# Patient Record
Sex: Male | Born: 1990 | Race: White | Hispanic: No | Marital: Single | Smoking: Never smoker
Health system: Southern US, Community
[De-identification: ages and names within clinical notes are randomized; demographics above are authoritative.]

## PROBLEM LIST (undated history)

## (undated) HISTORY — PX: BACK SURGERY: SHX140

---

## 2019-10-07 ENCOUNTER — Encounter: Payer: Self-pay | Admitting: Emergency Medicine

## 2019-10-07 ENCOUNTER — Emergency Department
Admission: EM | Admit: 2019-10-07 | Discharge: 2019-10-07 | Disposition: A | Discharge status: Home / Self Care | Payer: Self-pay | Attending: Emergency Medicine | Admitting: Emergency Medicine

## 2019-10-07 ENCOUNTER — Other Ambulatory Visit: Payer: Self-pay

## 2019-10-07 DIAGNOSIS — N390 Urinary tract infection, site not specified: Secondary | ICD-10-CM | POA: Insufficient documentation

## 2019-10-07 LAB — URINALYSIS, COMPLETE (UACMP) WITH MICROSCOPIC
Bilirubin Urine: NEGATIVE
Glucose, UA: NEGATIVE mg/dL
Hgb urine dipstick: NEGATIVE
Ketones, ur: NEGATIVE mg/dL
Leukocytes,Ua: NEGATIVE
Nitrite: NEGATIVE
Protein, ur: NEGATIVE mg/dL
Specific Gravity, Urine: 1.02 (ref 1.005–1.030)
Squamous Epithelial / HPF: NONE SEEN (ref 0–5)
pH: 7 (ref 5.0–8.0)

## 2019-10-07 LAB — COMPREHENSIVE METABOLIC PANEL
ALT: 19 U/L (ref 0–44)
AST: 19 U/L (ref 15–41)
Albumin: 4.8 g/dL (ref 3.5–5.0)
Alkaline Phosphatase: 43 U/L (ref 38–126)
Anion gap: 7 (ref 5–15)
BUN: 19 mg/dL (ref 6–20)
CO2: 28 mmol/L (ref 22–32)
Calcium: 9.4 mg/dL (ref 8.9–10.3)
Chloride: 102 mmol/L (ref 98–111)
Creatinine, Ser: 0.64 mg/dL (ref 0.61–1.24)
GFR calc Af Amer: >60 mL/min (ref >60)
GFR calc non Af Amer: >60 mL/min (ref >60)
Glucose, Bld: 142 mg/dL — ABNORMAL HIGH (ref 70–99)
Potassium: 4 mmol/L (ref 3.5–5.1)
Sodium: 137 mmol/L (ref 135–145)
Total Bilirubin: 1.4 mg/dL — ABNORMAL HIGH (ref 0.3–1.2)
Total Protein: 7.7 g/dL (ref 6.5–8.1)

## 2019-10-07 LAB — CBC
HCT: 42.6 % (ref 39.0–52.0)
Hemoglobin: 14.5 g/dL (ref 13.0–17.0)
MCH: 31.2 pg (ref 26.0–34.0)
MCHC: 34 g/dL (ref 30.0–36.0)
MCV: 91.6 fL (ref 80.0–100.0)
Platelets: 224 10*3/uL (ref 150–400)
RBC: 4.65 MIL/uL (ref 4.22–5.81)
RDW: 12 % (ref 11.5–15.5)
WBC: 15.8 10*3/uL — ABNORMAL HIGH (ref 4.0–10.5)
nRBC: 0 % (ref 0.0–0.2)

## 2019-10-07 LAB — LIPASE, BLOOD: Lipase: 37 U/L (ref 11–51)

## 2019-10-07 MED ORDER — SODIUM CHLORIDE 0.9% FLUSH: 3.0000 mL | Freq: Once | INTRAVENOUS | Status: DC

## 2019-10-07 MED ORDER — CEFTRIAXONE SODIUM 1 G IJ SOLR
1.0000 g | Freq: Once | INTRAMUSCULAR | Status: AC
  Administered 2019-10-07: 1 g via INTRAMUSCULAR

## 2019-10-07 MED ORDER — LIDOCAINE HCL (PF) 1 % IJ SOLN
5.0000 mL | Freq: Once | INTRAMUSCULAR | Status: AC
  Administered 2019-10-07: 5 mL
  Filled 2019-10-07: qty 5

## 2019-10-07 MED ORDER — CIPROFLOXACIN HCL 500 MG PO TABS
500.0000 mg | ORAL_TABLET | Freq: Two times a day (BID) | ORAL | 0 refills | Status: AC
Start: 2019-10-07 — End: 2019-10-17

## 2019-10-07 NOTE — ED Triage Notes (Addendum)
C/O lower abdominal pain today.   Denies diarrhea and vomiting.  C/O nausea and states pain worsens when walking.  States took antibiotic last night for chlamydia treatment.  AAOx3.  Skin warm and dry. NAD

## 2019-10-07 NOTE — ED Provider Notes (Signed)
[facility] Emergency Department Provider Note   ____________________________________________    I have reviewed the triage vital signs and the nursing notes.   HISTORY  Chief Complaint Abdominal Pain     HPI Arthur Dean is a 29 y.o. male who presents with complaints of suprapubic pain which started 1 day ago and seems to be getting worse.  He reports it is worse when he urinates.  Denies dysuria.  Denies penile discharge.  He reports his male partner was recently treated for chlamydia and he was also given a dose of antibiotics to be treated as the partner which he did take.  Denies fevers or chills.  No back pain.  History reviewed. No pertinent past medical history.  There are no problems to display for this patient.   Past Surgical History:  Procedure Laterality Date  . BACK SURGERY      Prior to Admission medications   Medication Sig Start Date End Date Taking? Authorizing Provider  ciprofloxacin (CIPRO) 500 MG tablet Take 1 tablet (500 mg total) by mouth 2 (two) times daily for 10 days. 10/07/19 10/17/19  Jene Every, MD     Allergies Amoxicillin  No family history on file.  Social History Social History   Tobacco Use  . Smoking status: Never Smoker  . Smokeless tobacco: Never Used  Substance Use Topics  . Alcohol use: Not Currently  . Drug use: Not Currently    Review of Systems  Constitutional: No fever/chills Eyes: No visual changes.  ENT: No sore throat. Cardiovascular: Denies chest pain. Respiratory: Denies shortness of breath. Gastrointestinal: As above Genitourinary: As above Musculoskeletal: Negative for back pain. Skin: Negative for rash. Neurological: Negative for headaches or weakness   ____________________________________________   PHYSICAL EXAM:  VITAL SIGNS: ED Triage Vitals  Enc Vitals Group     BP 10/07/19 1426 112/65     Pulse Rate 10/07/19 1426 90     Resp 10/07/19 1426 16     Temp  10/07/19 1426 98 F (36.7 C)     Temp Source 10/07/19 1426 Oral     SpO2 10/07/19 1426 98 %     Weight 10/07/19 1420 63.5 kg (140 lb)     Height 10/07/19 1420 1.88 m (6\' 2" )     Head Circumference --      Peak Flow --      Pain Score 10/07/19 1419 6     Pain Loc --      Pain Edu? --      Excl. in GC? --     Constitutional: Alert and oriented.  Nose: No congestion/rhinnorhea. Mouth/Throat: Mucous membranes are moist.    Cardiovascular: Normal rate, regular rhythm.   Good peripheral circulation. Respiratory: Normal respiratory effort.  No retractions.  Gastrointestinal: Soft, No distention.  No CVA tenderness. GU: No penile discharge, mild tenderness suprapubically musculoskeletal: No lower extremity tenderness nor edema.  Warm and well perfused Neurologic:  Normal speech and language.  Skin:  Skin is warm, dry and intact. No rash noted. Psychiatric: Mood and affect are normal. Speech and behavior are normal.  ____________________________________________   LABS (all labs ordered are listed, but only abnormal results are displayed)  Labs Reviewed  COMPREHENSIVE METABOLIC PANEL - Abnormal; Notable for the following components:      Result Value   Glucose, Bld 142 (*)    Total Bilirubin 1.4 (*)    All other components within normal limits  CBC - Abnormal; Notable for the following  components:   WBC 15.8 (*)    All other components within normal limits  URINALYSIS, COMPLETE (UACMP) WITH MICROSCOPIC - Abnormal; Notable for the following components:   Color, Urine YELLOW (*)    APPearance CLOUDY (*)    Bacteria, UA RARE (*)    All other components within normal limits  URINE CULTURE  LIPASE, BLOOD   ____________________________________________  EKG  None ____________________________________________  RADIOLOGY   ____________________________________________   PROCEDURES  Procedure(s) performed: No  Procedures   Critical Care performed:  No ____________________________________________   INITIAL IMPRESSION / ASSESSMENT AND PLAN / ED COURSE  Pertinent labs & imaging results that were available during my care of the patient were reviewed by me and considered in my medical decision making (see chart for details).  Patient well-appearing in no acute distress, lab work significant for elevated white blood cell count of 15.8 however the patient is afebrile without tachycardia.  Urinalysis most consistent with urinary tract infection will give 1 g IM Rocephin, p.o. antibiotics.  Close outpatient follow-up, strict return precautions discussed.    ____________________________________________   FINAL CLINICAL IMPRESSION(S) / ED DIAGNOSES  Final diagnoses:  Lower urinary tract infectious disease        Note:  This document was prepared using Dragon voice recognition software and may include unintentional dictation errors.   Lavonia Drafts, MD 10/07/19 [phone]

## 2019-10-08 LAB — URINE CULTURE: Culture: NO GROWTH

## 2020-01-05 ENCOUNTER — Other Ambulatory Visit: Payer: Self-pay

## 2020-01-05 ENCOUNTER — Emergency Department: Payer: Self-pay

## 2020-01-05 ENCOUNTER — Emergency Department
Admission: EM | Admit: 2020-01-05 | Discharge: 2020-01-05 | Disposition: A | Discharge status: Home / Self Care | Payer: Self-pay | Attending: Student in an Organized Health Care Education/Training Program | Admitting: Student in an Organized Health Care Education/Training Program

## 2020-01-05 DIAGNOSIS — Z20822 Contact with and (suspected) exposure to covid-19: Secondary | ICD-10-CM | POA: Insufficient documentation

## 2020-01-05 DIAGNOSIS — J069 Acute upper respiratory infection, unspecified: Secondary | ICD-10-CM | POA: Insufficient documentation

## 2020-01-05 LAB — SARS CORONAVIRUS 2 BY RT PCR (HOSPITAL ORDER, PERFORMED IN ~~LOC~~ HOSPITAL LAB): SARS Coronavirus 2: NEGATIVE

## 2020-01-05 MED ORDER — KETOROLAC TROMETHAMINE 10 MG PO TABS
10.0000 mg | ORAL_TABLET | Freq: Four times a day (QID) | ORAL | 0 refills | Status: DC | PRN
Start: 2020-01-05 — End: 2020-05-17

## 2020-01-05 MED ORDER — PSEUDOEPH-BROMPHEN-DM 30-2-10 MG/5ML PO SYRP
5.0000 mL | ORAL_SOLUTION | Freq: Four times a day (QID) | ORAL | 0 refills | Status: DC | PRN
Start: 2020-01-05 — End: 2020-05-17

## 2020-01-05 MED ORDER — KETOROLAC TROMETHAMINE 60 MG/2ML IM SOLN
60.0000 mg | Freq: Once | INTRAMUSCULAR | Status: AC
  Administered 2020-01-05: 60 mg via INTRAMUSCULAR
  Filled 2020-01-05: qty 2

## 2020-01-05 NOTE — ED Provider Notes (Signed)
[facility] Emergency Department Provider Note   ____________________________________________   First MD Initiated Contact with Patient 01/05/20 1311     (approximate)  I have reviewed the triage vital signs and the nursing notes.   HISTORY  Chief Complaint URI    HPI Arthur Dean is a 29 y.o. male patient: Call for any notes of body aches for 2 days.  Patient denies recent travel or known contact with COVID-19.  Patient has not received a vaccine for COVID-19.  Patient rates his pain as a 5/10.  Patient described pain as "achy".  No palliative measures for complaint.         History reviewed. No pertinent past medical history.  There are no problems to display for this patient.   Past Surgical History:  Procedure Laterality Date  . BACK SURGERY      Prior to Admission medications   Medication Sig Start Date End Date Taking? Authorizing Provider  brompheniramine-pseudoephedrine-DM 30-2-10 MG/5ML syrup Take 5 mLs by mouth 4 (four) times daily as needed. 01/05/20   Joni Reining, PA-C  ketorolac (TORADOL) 10 MG tablet Take 1 tablet (10 mg total) by mouth every 6 (six) hours as needed. 01/05/20   Joni Reining, PA-C    Allergies Amoxicillin  No family history on file.  Social History Social History   Tobacco Use  . Smoking status: Never Smoker  . Smokeless tobacco: Never Used  Substance Use Topics  . Alcohol use: Not Currently  . Drug use: Not Currently    Review of Systems Constitutional: No fever/chills.  Body aches. Eyes: No visual changes. ENT: No sore throat.  Runny nose. Cardiovascular: Denies chest pain. Respiratory: Denies shortness of breath.  Nonproductive cough. Gastrointestinal: No abdominal pain.  No nausea, no vomiting.  No diarrhea.  No constipation. Genitourinary: Negative for dysuria. Musculoskeletal: Negative for back pain. Skin: Negative for rash. Neurological: Negative for headaches, focal weakness or  numbness. Allergic/Immunilogical: Amoxicillin ____________________________________________   PHYSICAL EXAM:  VITAL SIGNS: ED Triage Vitals  Enc Vitals Group     BP 01/05/20 1258 105/61     Pulse Rate 01/05/20 1258 (!) 56     Resp 01/05/20 1256 17     Temp 01/05/20 1300 98.1 F (36.7 C)     Temp Source 01/05/20 1256 Oral     SpO2 01/05/20 1258 100 %     Weight 01/05/20 1257 145 lb (65.8 kg)     Height 01/05/20 1257 6\' 2"  (1.88 m)     Head Circumference --      Peak Flow --      Pain Score 01/05/20 1257 5     Pain Loc --      Pain Edu? --      Excl. in GC? --     Constitutional: Alert and oriented. Well appearing and in no acute distress. Eyes: Conjunctivae are normal. PERRL. EOMI. Head: Atraumatic. Nose: Edematous nasal turbinates clear rhinorrhea. Mouth/Throat: Mucous membranes are moist.  Oropharynx non-erythematous.  Postnasal drainage. Neck: No stridor.  Hematological/Lymphatic/Immunilogical: No cervical lymphadenopathy. Cardiovascular: Bradycardic., regular rhythm. Grossly normal heart sounds.  Good peripheral circulation. Respiratory: Normal respiratory effort.  No retractions. Lungs CTAB. Gastrointestinal: Soft and nontender. No distention. No abdominal bruits. No CVA tenderness. Neurologic:  Normal speech and language. No gross focal neurologic deficits are appreciated. No gait instability. Skin:  Skin is warm, dry and intact. No rash noted. Psychiatric: Mood and affect are normal. Speech and behavior are normal.  ____________________________________________  LABS (all labs ordered are listed, but only abnormal results are displayed)  Labs Reviewed  SARS CORONAVIRUS 2 BY RT PCR (HOSPITAL ORDER, PERFORMED IN  HOSPITAL LAB)   ____________________________________________  EKG   ____________________________________________  RADIOLOGY  ED MD interpretation:    Official radiology report(s): DG Chest Portable 1 View  Result Date:  01/05/2020 CLINICAL DATA:  Cough and congestion. EXAM: PORTABLE CHEST 1 VIEW COMPARISON:  None. FINDINGS: The heart size and mediastinal contours are within normal limits. Both lungs are clear. No pleural effusion or pneumothorax. Skeletal structures are intact. IMPRESSION: No active disease. Electronically Signed   By: Amie Portland M.D.   On: 01/05/2020 13:57    ____________________________________________   PROCEDURES  Procedure(s) performed (including Critical Care):  Procedures   ____________________________________________   INITIAL IMPRESSION / ASSESSMENT AND PLAN / ED COURSE  As part of my medical decision making, I reviewed the following data within the electronic MEDICAL RECORD NUMBER     Patient presents with cute onset of cough, runny nose, and body aches. Patient was concerned for COVID-19 as he is not taking the vaccine. Discussed negative COVID-19 test results with patient. No acute findings on chest x-ray. Patient complaining physical exam consistent with viral respiratory infection. Patient given discharge care instruction advised take medication as directed. Patient advised to consider getting the COVID-19 vaccine. Advised to establish care with PCP.    [name] was evaluated in Emergency Department on 01/05/2020 for the symptoms described in the history of present illness. He was evaluated in the context of the global COVID-19 pandemic, which necessitated consideration that the patient might be at risk for infection with the SARS-CoV-2 virus that causes COVID-19. Institutional protocols and algorithms that pertain to the evaluation of patients at risk for COVID-19 are in a state of rapid change based on information released by regulatory bodies including the CDC and federal and state organizations. These policies and algorithms were followed during the patient's care in the ED.       ____________________________________________   FINAL CLINICAL IMPRESSION(S) / ED  DIAGNOSES  Final diagnoses:  Acute upper respiratory infection     ED Discharge Orders         Ordered    brompheniramine-pseudoephedrine-DM 30-2-10 MG/5ML syrup  4 times daily PRN     Discontinue  Reprint     01/05/20 1534    ketorolac (TORADOL) 10 MG tablet  Every 6 hours PRN     Discontinue  Reprint     01/05/20 1534           Note:  This document was prepared using Dragon voice recognition software and may include unintentional dictation errors.    Joni Reining, PA-C 01/05/20 1537    Willy Eddy, MD 01/05/20 [phone]

## 2020-01-05 NOTE — Discharge Instructions (Addendum)
COVID-19 test was negative. His chest x-ray also was unremarkable. Follow discharge care instruction take medication as directed. Take Tylenol as needed for fever. Consider getting the vaccine as soon as possible.

## 2020-01-05 NOTE — ED Triage Notes (Signed)
Pt c/o cough, runny nose, and body aches since yesterday. 

## 2020-01-05 NOTE — ED Notes (Signed)
See triage note  Presents with cough and chest discomfort with cough  And nausea    States body aches started yesterday  Afebrile on arrival

## 2020-02-04 ENCOUNTER — Emergency Department: Payer: HRSA Program

## 2020-02-04 ENCOUNTER — Emergency Department
Admission: EM | Admit: 2020-02-04 | Discharge: 2020-02-04 | Disposition: A | Discharge status: Home / Self Care | Payer: HRSA Program | Attending: Emergency Medicine | Admitting: Emergency Medicine

## 2020-02-04 ENCOUNTER — Other Ambulatory Visit: Payer: Self-pay

## 2020-02-04 DIAGNOSIS — W010XXA Fall on same level from slipping, tripping and stumbling without subsequent striking against object, initial encounter: Secondary | ICD-10-CM | POA: Insufficient documentation

## 2020-02-04 DIAGNOSIS — M549 Dorsalgia, unspecified: Secondary | ICD-10-CM | POA: Diagnosis present

## 2020-02-04 DIAGNOSIS — Z20822 Contact with and (suspected) exposure to covid-19: Secondary | ICD-10-CM | POA: Insufficient documentation

## 2020-02-04 DIAGNOSIS — W19XXXA Unspecified fall, initial encounter: Secondary | ICD-10-CM

## 2020-02-04 LAB — SARS CORONAVIRUS 2 BY RT PCR (HOSPITAL ORDER, PERFORMED IN ~~LOC~~ HOSPITAL LAB): SARS Coronavirus 2: NEGATIVE

## 2020-02-04 NOTE — ED Triage Notes (Signed)
Pt states he slipped on steps letting th dog out and hit his mid back and having pain since.. pt also states he wants a covid test states his step son is positive denies having sx at present.

## 2020-02-04 NOTE — ED Notes (Signed)
See triage note  Presents with lower back pain  States he slipped on wet step  Hitting his back  Also states he has been exposed to COVID by room mate  Denies any sx's

## 2020-02-04 NOTE — ED Provider Notes (Signed)
[facility] Emergency Department Provider Note  ____________________________________________  Time seen: Approximately 3:01 PM  I have reviewed the triage vital signs and the nursing notes.   HISTORY  Chief Complaint Back Pain and Covid Exposure    HPI Arthur Dean is a 29 y.o. male that presents to the emergency department for a Covid test hand for evaluation of mild low back pain after falling this morning.  Patient was letting out the dog when he slipped and his back hit the bottom step.  He did not fall down the steps.  He has been walking normally since.  He does not feel that anything is broken.  He came to the emergency department because his stepson is positive for COVID-19 and he would like a Covid test.  He is scheduled to have the Pfizer vaccine today at 330 if his test is negative.   History reviewed. No pertinent past medical history.  There are no problems to display for this patient.   Past Surgical History:  Procedure Laterality Date  . BACK SURGERY      Prior to Admission medications   Medication Sig Start Date End Date Taking? Authorizing Provider  brompheniramine-pseudoephedrine-DM 30-2-10 MG/5ML syrup Take 5 mLs by mouth 4 (four) times daily as needed. 01/05/20   Joni Reining, PA-C  ketorolac (TORADOL) 10 MG tablet Take 1 tablet (10 mg total) by mouth every 6 (six) hours as needed. 01/05/20   Joni Reining, PA-C    Allergies Amoxicillin  No family history on file.  Social History Social History   Tobacco Use  . Smoking status: Never Smoker  . Smokeless tobacco: Never Used  Substance Use Topics  . Alcohol use: Not Currently  . Drug use: Not Currently     Review of Systems  Constitutional: No fever/chills ENT: No upper respiratory complaints. Cardiovascular: No chest pain. Respiratory: No cough. No SOB. Gastrointestinal: No abdominal pain.  No nausea, no vomiting.  Musculoskeletal: Positive for back  pain. Skin: Negative for rash, abrasions, lacerations, ecchymosis.   ____________________________________________   PHYSICAL EXAM:  VITAL SIGNS: ED Triage Vitals  Enc Vitals Group     BP 02/04/20 1127 (!) 106/47     Pulse Rate 02/04/20 1127 91     Resp 02/04/20 1127 16     Temp 02/04/20 1127 98.1 F (36.7 C)     Temp Source 02/04/20 1127 Oral     SpO2 02/04/20 1127 99 %     Weight 02/04/20 1125 143 lb (64.9 kg)     Height 02/04/20 1125 6\' 2"  (1.88 m)     Head Circumference --      Peak Flow --      Pain Score 02/04/20 1124 8     Pain Loc --      Pain Edu? --      Excl. in GC? --      Constitutional: Alert and oriented. Well appearing and in no acute distress. Eyes: Conjunctivae are normal. PERRL. EOMI. Head: Atraumatic. ENT:      Ears:      Nose: No congestion/rhinnorhea.      Mouth/Throat: Mucous membranes are moist.  Neck: No stridor.  Cardiovascular: Normal rate, regular rhythm.  Good peripheral circulation. Respiratory: Normal respiratory effort without tachypnea or retractions. Lungs CTAB. Good air entry to the bases with no decreased or absent breath sounds. Musculoskeletal: Full range of motion to all extremities. No gross deformities appreciated.  Tenderness to palpation to lumbar spine and lumbar paraspinal  muscles.  No pinpoint tenderness.  Strength equal in lower extremities bilaterally normal gait. Neurologic:  Normal speech and language. No gross focal neurologic deficits are appreciated.  Skin:  Skin is warm, dry and intact. No rash noted. Psychiatric: Mood and affect are normal. Speech and behavior are normal. Patient exhibits appropriate insight and judgement.   ____________________________________________   LABS (all labs ordered are listed, but only abnormal results are displayed)  Labs Reviewed  SARS CORONAVIRUS 2 BY RT PCR (HOSPITAL ORDER, PERFORMED IN [facility])    ____________________________________________  EKG   ____________________________________________  RADIOLOGY Lexine Baton, personally viewed and evaluated these images (plain radiographs) as part of my medical decision making, as well as reviewing the written report by the radiologist.  DG Lumbar Spine 2-3 Views  Result Date: 02/04/2020 CLINICAL DATA:  Low back pain after fall. EXAM: LUMBAR SPINE - 2-3 VIEW COMPARISON:  None. FINDINGS: There is no evidence of lumbar spine fracture. Alignment is normal. Intervertebral disc spaces are maintained. IMPRESSION: Negative. Electronically Signed   By: Lupita Raider M.D.   On: 02/04/2020 14:27    ____________________________________________    PROCEDURES  Procedure(s) performed:    Procedures    Medications - No data to display   ____________________________________________   INITIAL IMPRESSION / ASSESSMENT AND PLAN / ED COURSE  Pertinent labs & imaging results that were available during my care of the patient were reviewed by me and considered in my medical decision making (see chart for details).  Review of the [place] CSRS was performed in accordance of the [facility] prior to dispensing any controlled drugs.    Patient presented to emergency department for a Covid test and for evaluation after a fall this morning.  Vital signs and exam are reassuring.  Covid test is negative.  X-ray negative for acute bony abnormalities.  Patient declines any pain medication.  Patient is to follow up with primary care as directed. Patient is given ED precautions to return to the ED for any worsening or new symptoms.   Arthur Dean was evaluated in Emergency Department on 02/04/2020 for the symptoms described in the history of present illness. He was evaluated in the context of the global COVID-19 pandemic, which necessitated consideration that the patient might be at risk for infection with the SARS-CoV-2 virus that causes COVID-19.  Institutional protocols and algorithms that pertain to the evaluation of patients at risk for COVID-19 are in a state of rapid change based on information released by regulatory bodies including the CDC and federal and state organizations. These policies and algorithms were followed during the patient's care in the ED.  ____________________________________________  FINAL CLINICAL IMPRESSION(S) / ED DIAGNOSES  Final diagnoses:  Fall, initial encounter  Exposure to COVID-19 virus      NEW MEDICATIONS STARTED DURING THIS VISIT:  ED Discharge Orders    None          This chart was dictated using voice recognition software/Dragon. Despite best efforts to proofread, errors can occur which can change the meaning. Any change was purely unintentional.    Enid Derry, PA-C 02/04/20 1524    Gilles Chiquito, MD 02/04/20 [phone]

## 2020-05-17 ENCOUNTER — Other Ambulatory Visit: Payer: Self-pay

## 2020-05-17 ENCOUNTER — Emergency Department
Admission: EM | Admit: 2020-05-17 | Discharge: 2020-05-17 | Disposition: A | Discharge status: Home / Self Care | Payer: Self-pay | Attending: Emergency Medicine | Admitting: Emergency Medicine

## 2020-05-17 DIAGNOSIS — M549 Dorsalgia, unspecified: Secondary | ICD-10-CM | POA: Insufficient documentation

## 2020-05-17 DIAGNOSIS — T8089XA Other complications following infusion, transfusion and therapeutic injection, initial encounter: Secondary | ICD-10-CM | POA: Insufficient documentation

## 2020-05-17 DIAGNOSIS — R52 Pain, unspecified: Secondary | ICD-10-CM

## 2020-05-17 MED ORDER — LIDOCAINE 5 % EX PTCH
1.0000 | MEDICATED_PATCH | Freq: Once | CUTANEOUS | Status: DC
  Administered 2020-05-17: 1 via TRANSDERMAL
  Filled 2020-05-17: qty 1

## 2020-05-17 MED ORDER — ACETAMINOPHEN-CODEINE #3 300-30 MG PO TABS: 1.0000 | ORAL_TABLET | Freq: Four times a day (QID) | ORAL | 0 refills | Status: AC | PRN

## 2020-05-17 NOTE — ED Triage Notes (Signed)
Pt comes via POV from home with c/o left arm pain following pfizer shot yesterday. Pt states pain to arm and some back pain.  No obvious rash noted.

## 2020-05-17 NOTE — Discharge Instructions (Signed)
Your exam is normal at this time. Take OTC Tylenol and Motrin as needed. Apply ice to reduce pain and swelling. Follow-up with your provider or return if needed.

## 2020-05-17 NOTE — ED Provider Notes (Signed)
[facility] Emergency Department Provider Note ____________________________________________  Time seen: 1225  I have reviewed the triage vital signs and the nursing notes.  HISTORY  Chief Complaint  Arm Pain   HPI Arthur Dean is a 29 y.o. male presents himself to the ED for evaluation of acute  abdominal pain.  Patient describes he received a the 2nd shot of his Pfizer vaccine yesterday.  He reports some pain to the upper deltoid of the left arm as well as some mild back pain.  He denies any chest pain, shortness of breath, fever, chills, sweats.  He also denies any distal paresthesias, grip changes, or upper extremity edema.  History reviewed. No pertinent past medical history.  There are no problems to display for this patient.   Past Surgical History:  Procedure Laterality Date  . BACK SURGERY      Prior to Admission medications   Medication Sig Start Date End Date Taking? Authorizing Provider  acetaminophen-codeine (TYLENOL #3) 300-30 MG tablet Take 1 tablet by mouth every 6 (six) hours as needed for moderate pain. 05/17/20   Arthur Dean, Arthur Ivory, PA-C    Allergies Amoxicillin  No family history on file.  Social History Social History   Tobacco Use  . Smoking status: Never Smoker  . Smokeless tobacco: Never Used  Substance Use Topics  . Alcohol use: Not Currently  . Drug use: Not Currently    Review of Systems  Constitutional: Negative for fever. Cardiovascular: Negative for chest pain. Respiratory: Negative for shortness of breath. Gastrointestinal: Negative for abdominal pain, vomiting and diarrhea. Genitourinary: Negative for dysuria. Musculoskeletal: Negative for back pain.  Left arm pain as above. Skin: Negative for rash. Neurological: Negative for headaches, focal weakness or numbness. ____________________________________________  PHYSICAL EXAM:  VITAL SIGNS: ED Triage Vitals  Enc Vitals Group     BP 05/17/20  1140 100/69     Pulse Rate 05/17/20 1140 89     Resp 05/17/20 1140 18     Temp 05/17/20 1140 97.7 F (36.5 C)     Temp src --      SpO2 05/17/20 1140 100 %     Weight 05/17/20 1139 145 lb (65.8 kg)     Height 05/17/20 1139 6\' 3"  (1.905 m)     Head Circumference --      Peak Flow --      Pain Score 05/17/20 1139 5     Pain Loc --      Pain Edu? --      Excl. in GC? --     Constitutional: Alert and oriented. Well appearing and in no distress. Head: Normocephalic and atraumatic. Eyes: Conjunctivae are normal. PERRL. Normal extraocular movements Neck: Supple. No thyromegaly. Hematological/Lymphatic/Immunological: No cervical lymphadenopathy. Cardiovascular: Normal rate, regular rhythm. Normal distal pulses and cap refill. Respiratory: Normal respiratory effort. No wheezes/rales/rhonchi. Gastrointestinal: Soft and nontender. No distention. Musculoskeletal: No composite fist bilaterally.  Decreased active range of motion to the left arm secondary to deltoid pain.  Patient is tender to palpation over the left deltoid.  Nontender with normal range of motion in all extremities.  Neurologic: Cranial nerves II to XII grossly intact.  Normal gait without ataxia. Normal speech and language. No gross focal neurologic deficits are appreciated. Skin:  Skin is warm, dry and intact. No rash noted.  No erythema, induration, warmth, or other skin defect is noted over the left deltoid.  No cyanosis, clubbing, or edema noted distally. Psychiatric: Mood and affect are normal. Patient  exhibits appropriate insight and judgment. ____________________________________________  PROCEDURES  Procedures  Lidoderm 5% patch - LUE ____________________________________________  INITIAL IMPRESSION / ASSESSMENT AND PLAN / ED COURSE  DDX: myalgia, injection site reaction, sterile abscess  Patient with ED evaluation of left arm pain following a vaccine given yesterday. His exam is normal and reassuring at this time.  No neuromuscular deficits noted. No indication of a sterile abscess or cellulitis. He will be treated with Tylenol, Motrin, and previously prescribed muscle relaxant. He will apply ice and/o moist heat to reduce symptoms.   Arthur Dean was evaluated in Emergency Department on 05/17/2020 for the symptoms described in the history of present illness. He was evaluated in the context of the global COVID-19 pandemic, which necessitated consideration that the patient might be at risk for infection with the SARS-CoV-2 virus that causes COVID-19. Institutional protocols and algorithms that pertain to the evaluation of patients at risk for COVID-19 are in a state of rapid change based on information released by regulatory bodies including the CDC and federal and state organizations. These policies and algorithms were followed during the patient's care in the ED. ____________________________________________  FINAL CLINICAL IMPRESSION(S) / ED DIAGNOSES  Final diagnoses:  Pain at injection site, initial encounter      Lissa Hoard, PA-C 05/17/20 Raquel James, MD 05/21/20 [phone]

## 2020-06-10 ENCOUNTER — Encounter: Payer: Self-pay | Admitting: Emergency Medicine

## 2020-06-10 ENCOUNTER — Other Ambulatory Visit: Payer: Self-pay

## 2020-06-10 ENCOUNTER — Emergency Department
Admission: EM | Admit: 2020-06-10 | Discharge: 2020-06-10 | Disposition: A | Discharge status: Home / Self Care | Payer: Self-pay | Attending: Emergency Medicine | Admitting: Emergency Medicine

## 2020-06-10 ENCOUNTER — Emergency Department: Payer: Self-pay

## 2020-06-10 DIAGNOSIS — S6991XA Unspecified injury of right wrist, hand and finger(s), initial encounter: Secondary | ICD-10-CM

## 2020-06-10 DIAGNOSIS — Y9367 Activity, basketball: Secondary | ICD-10-CM | POA: Insufficient documentation

## 2020-06-10 DIAGNOSIS — W2209XA Striking against other stationary object, initial encounter: Secondary | ICD-10-CM | POA: Insufficient documentation

## 2020-06-10 DIAGNOSIS — S60051A Contusion of right little finger without damage to nail, initial encounter: Secondary | ICD-10-CM | POA: Insufficient documentation

## 2020-06-10 NOTE — ED Provider Notes (Signed)
[facility] Emergency Department Provider Note  ____________________________________________   Event Date/Time   First MD Initiated Contact with Patient 06/10/20 Paulo Fruit     (approximate)  I have reviewed the triage vital signs and the nursing notes.   HISTORY  Chief Complaint Hand Injury   HPI Arthur Dean is a 30 y.o. male without significant past medical history who presents for assessment of some pain in his right fifth digit after he states he slammed on the floor pretty hard last night while playing basketball.  It sounds like it may have been hyperextended.  He states he has had some pain and decreased range of motion in the fifth right digit since this happened.  He denies any other acute pain or injuries.  No other significant past medical history.  He states he is otherwise been in his usual state of health without any recent fevers, chills, cough, nausea, vomiting, diarrhea, dysuria, rash or any other acute sick symptoms.  He denies any other acute concerns at this time.         History reviewed. No pertinent past medical history.  There are no problems to display for this patient.   Past Surgical History:  Procedure Laterality Date  . BACK SURGERY      Prior to Admission medications   Medication Sig Start Date End Date Taking? Authorizing Provider  acetaminophen-codeine (TYLENOL #3) 300-30 MG tablet Take 1 tablet by mouth every 6 (six) hours as needed for moderate pain. 05/17/20   Menshew, Charlesetta Ivory, PA-C    Allergies Amoxicillin  No family history on file.  Social History Social History   Tobacco Use  . Smoking status: Never Smoker  . Smokeless tobacco: Never Used  Substance Use Topics  . Alcohol use: Not Currently  . Drug use: Not Currently    Review of Systems  Review of Systems  Constitutional: Negative for chills and fever.  HENT: Negative for sore throat.   Eyes: Negative for pain.  Respiratory: Negative  for cough and stridor.   Cardiovascular: Negative for chest pain.  Gastrointestinal: Negative for vomiting.  Genitourinary: Negative for dysuria.  Musculoskeletal: Positive for joint pain ( 5th digit R hand ) and myalgias ( R 5th digit).  Skin: Negative for rash.  Neurological: Negative for seizures, loss of consciousness and headaches.  Psychiatric/Behavioral: Negative for suicidal ideas.  All other systems reviewed and are negative.     ____________________________________________   PHYSICAL EXAM:  VITAL SIGNS: ED Triage Vitals  Enc Vitals Group     BP 06/10/20 1551 131/73     Pulse Rate 06/10/20 1551 78     Resp 06/10/20 1551 20     Temp 06/10/20 1551 98.3 F (36.8 C)     Temp Source 06/10/20 1551 Oral     SpO2 06/10/20 1551 99 %     Weight 06/10/20 1550 143 lb (64.9 kg)     Height 06/10/20 1550 6\' 3"  (1.905 m)     Head Circumference --      Peak Flow --      Pain Score 06/10/20 1550 7     Pain Loc --      Pain Edu? --      Excl. in GC? --    Vitals:   06/10/20 1551  BP: 131/73  Pulse: 78  Resp: 20  Temp: 98.3 F (36.8 C)  SpO2: 99%   Physical Exam Vitals and nursing note reviewed.  Constitutional:  Appearance: Normal appearance. He is well-developed, normal weight and well-nourished.  HENT:     Head: Normocephalic and atraumatic.     Right Ear: External ear normal.     Left Ear: External ear normal.     Nose: Nose normal.     Mouth/Throat:     Mouth: Mucous membranes are moist.  Eyes:     Conjunctiva/sclera: Conjunctivae normal.  Cardiovascular:     Rate and Rhythm: Normal rate and regular rhythm.     Heart sounds: No murmur heard.   Pulmonary:     Effort: Pulmonary effort is normal. No respiratory distress.     Breath sounds: Normal breath sounds.  Abdominal:     Palpations: Abdomen is soft.     Tenderness: There is no abdominal tenderness.  Musculoskeletal:        General: No edema.     Cervical back: Neck supple.     Right lower leg:  No edema.     Left lower leg: No edema.  Skin:    General: Skin is warm and dry.     Capillary Refill: Capillary refill takes less than 2 seconds.  Neurological:     Mental Status: He is alert and oriented to person, place, and time.  Psychiatric:        Mood and Affect: Mood and affect and mood normal.     Right hand has some ecchymosis and tenderness circumferentially about the fifth digit at the proximal interphalangeal joint and metacarpal phalangeal joint.  2+ bilateral radial pulses.  Patient is sensation in the distribution of the radial ulnar and median nerves intact throughout the right hand.  Other digits are unremarkable and patient has no tenderness over the hand palm or other digits.  He is able to flex all digits against resistance but has slightly decreased active range of motion at the right fifth digit and slightly decreased drink secondary to pain.  Less than 2-second cap refill in all digits including the fifth digit.  ____________________________________________   ____________________________________________  RADIOLOGY  ED MD interpretation: No fracture or dislocation.  Official radiology report(s): DG Hand Complete Right  Result Date: 06/10/2020 CLINICAL DATA:  Right hand pain after injury. EXAM: RIGHT HAND - COMPLETE 3+ VIEW COMPARISON:  None. FINDINGS: There is no evidence of fracture or dislocation. There is no evidence of arthropathy or other focal bone abnormality. Soft tissues are unremarkable. IMPRESSION: Negative. Electronically Signed   By: Lupita Raider M.D.   On: 06/10/2020 16:26    ____________________________________________   PROCEDURES  Procedure(s) performed (including Critical Care):  Procedures   ____________________________________________   INITIAL IMPRESSION / ASSESSMENT AND PLAN / ED COURSE      Patient presents with above-stated history and exam for assessment of right fifth digit pain after he slammed against the floor while  playing basketball last night.  He is afebrile and hemodynamically stable arrival.  He is neurovascular intact throughout his right hand which shows some ecchymosis and tenderness and will slightly decrease strength and range of motion at the metacarpal phalangeal joint and proximal interphalangeal joint right digit.  He has no evidence of tendon injury.  X-ray shows no evidence of fracture or dislocation.  No snuffbox tenderness stable suspicion for occult scaphoid injury.  Impression is contusion.  Advised Tylenol and ibuprofen and avoiding any activities that could predispose to further injury for the next 1 to 2 weeks.  Discharged stable condition.  Strict term cautions advised and discussed.  ____________________________________________   FINAL CLINICAL IMPRESSION(S) / ED DIAGNOSES  Final diagnoses:  Injury of finger of right hand, initial encounter    Medications - No data to display   ED Discharge Orders    None       Note:  This document was prepared using Dragon voice recognition software and may include unintentional dictation errors.   Gilles Chiquito, MD 06/10/20 [phone]

## 2020-06-10 NOTE — ED Triage Notes (Signed)
Pt reports plays basketball and last night was going for the ball and slammed his hand on the floor pretty hard. Pt states continued pain and not able to move 5th digit much

## 2020-11-02 ENCOUNTER — Ambulatory Visit: Payer: Self-pay

## 2022-03-03 IMAGING — DX DG CHEST 1V PORT
2 series · 2 of 2 positions shown · non-contrast
Comparison: None.

CLINICAL DATA: Cough and congestion.

EXAM:
PORTABLE CHEST 1 VIEW

[chest ap (1 of 2)]
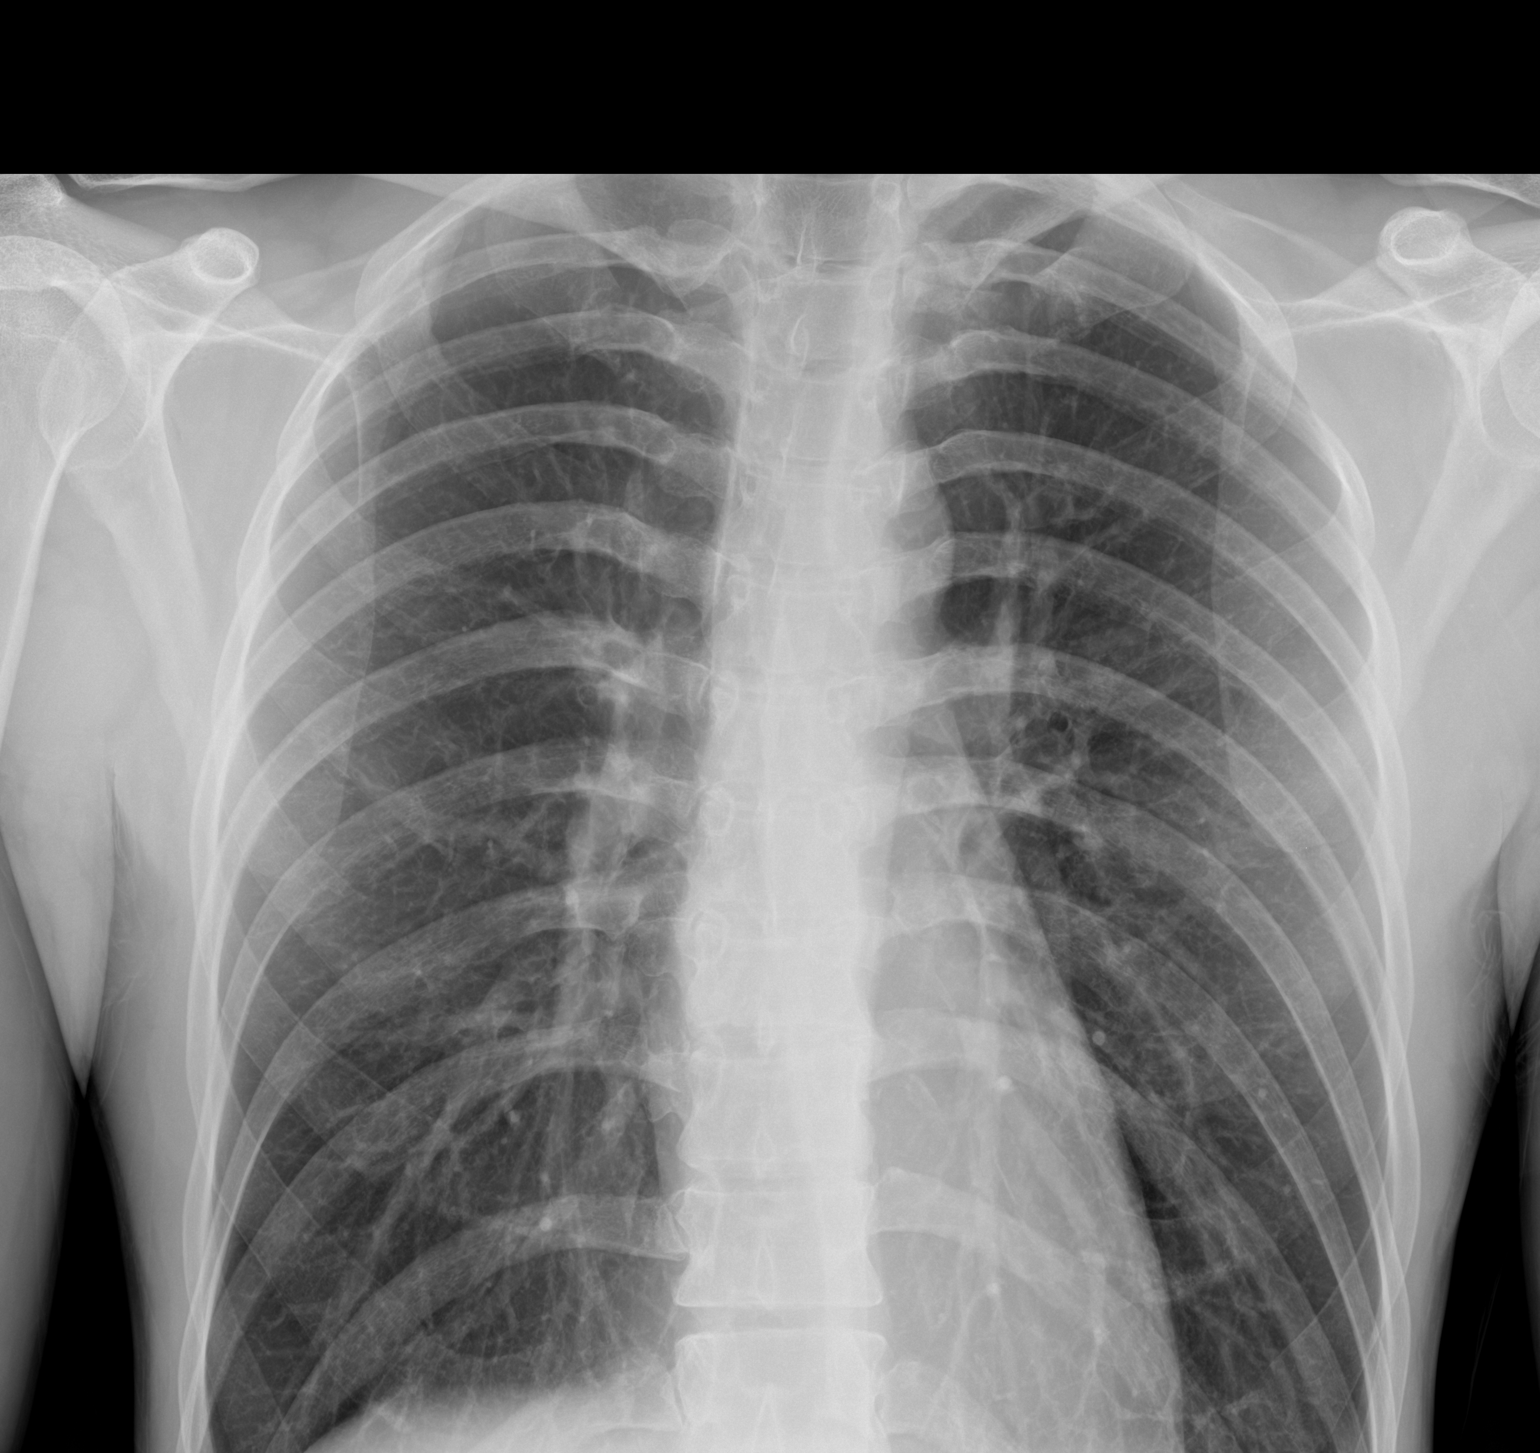

[chest ap (2 of 2)]
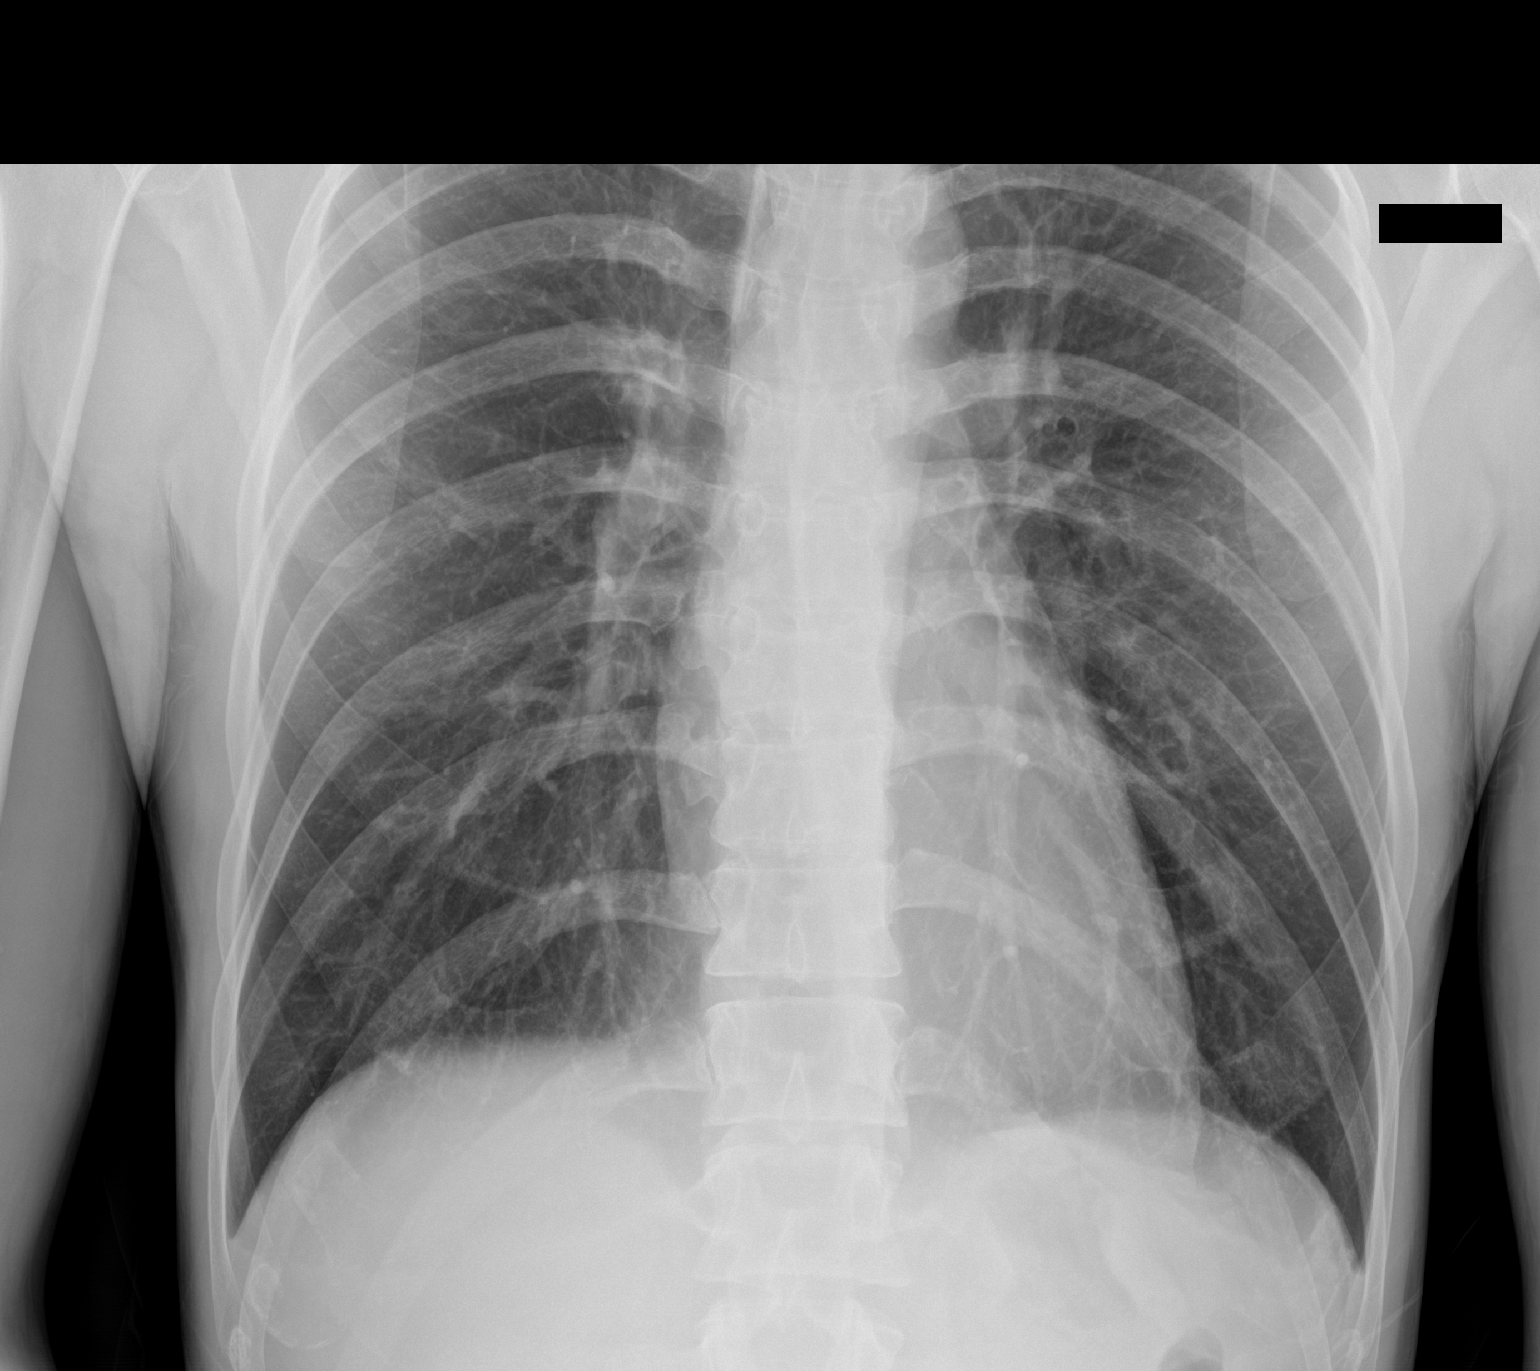

[2 of 2 positions shown; findings below may reference images not displayed]

FINDINGS: The heart size and mediastinal contours are within normal limits.
Both lungs are clear. No pleural effusion or pneumothorax. Skeletal
structures are intact.
IMPRESSION: No active disease.
